# Patient Record
Sex: Male | Born: 1975 | Race: Black or African American | Hispanic: No | Smoking: Former smoker
Health system: Southern US, Community
[De-identification: ages and names within clinical notes are randomized; demographics above are authoritative.]

## PROBLEM LIST (undated history)

## (undated) DIAGNOSIS — I509 Heart failure, unspecified: Secondary | ICD-10-CM

## (undated) DIAGNOSIS — J449 Chronic obstructive pulmonary disease, unspecified: Secondary | ICD-10-CM

## (undated) DIAGNOSIS — M109 Gout, unspecified: Secondary | ICD-10-CM

## (undated) DIAGNOSIS — I10 Essential (primary) hypertension: Secondary | ICD-10-CM

## (undated) DIAGNOSIS — E119 Type 2 diabetes mellitus without complications: Secondary | ICD-10-CM

## (undated) HISTORY — PX: HEART TRANSPLANT: SHX268

## (undated) HISTORY — PX: CARDIAC DEFIBRILLATOR PLACEMENT: SHX171

---

## 2014-02-07 ENCOUNTER — Emergency Department (HOSPITAL_COMMUNITY)
Admission: EM | Admit: 2014-02-07 | Discharge: 2014-02-07 | Disposition: A | Discharge status: Home / Self Care | Payer: Medicaid Other | Attending: Emergency Medicine | Admitting: Emergency Medicine

## 2014-02-07 ENCOUNTER — Emergency Department (HOSPITAL_COMMUNITY): Payer: Medicaid Other

## 2014-02-07 DIAGNOSIS — X58XXXA Exposure to other specified factors, initial encounter: Secondary | ICD-10-CM | POA: Diagnosis not present

## 2014-02-07 DIAGNOSIS — S93409A Sprain of unspecified ligament of unspecified ankle, initial encounter: Secondary | ICD-10-CM | POA: Insufficient documentation

## 2014-02-07 DIAGNOSIS — S93401A Sprain of unspecified ligament of right ankle, initial encounter: Secondary | ICD-10-CM

## 2014-02-07 DIAGNOSIS — Y9289 Other specified places as the place of occurrence of the external cause: Secondary | ICD-10-CM | POA: Insufficient documentation

## 2014-02-07 DIAGNOSIS — S99919A Unspecified injury of unspecified ankle, initial encounter: Secondary | ICD-10-CM

## 2014-02-07 DIAGNOSIS — Y9389 Activity, other specified: Secondary | ICD-10-CM | POA: Insufficient documentation

## 2014-02-07 DIAGNOSIS — S8990XA Unspecified injury of unspecified lower leg, initial encounter: Secondary | ICD-10-CM | POA: Insufficient documentation

## 2014-02-07 DIAGNOSIS — S99929A Unspecified injury of unspecified foot, initial encounter: Secondary | ICD-10-CM

## 2014-02-07 MED ORDER — OXYCODONE-ACETAMINOPHEN 5-325 MG PO TABS: 1.0000 | ORAL_TABLET | ORAL | Status: DC | PRN

## 2014-02-07 MED ORDER — OXYCODONE-ACETAMINOPHEN 5-325 MG PO TABS
1.0000 | ORAL_TABLET | Freq: Once | ORAL | Status: AC
  Administered 2014-02-07: 1 via ORAL
  Filled 2014-02-07: qty 1

## 2014-02-07 NOTE — Discharge Instructions (Signed)
Wear ankle splint orthotic as needed.   Ankle Sprain An ankle sprain is an injury to the strong, fibrous tissues (ligaments) that hold the bones of your ankle joint together.  CAUSES An ankle sprain is usually caused by a fall or by twisting your ankle. Ankle sprains most commonly occur when you step on the outer edge of your foot, and your ankle turns inward. People who participate in sports are more prone to these types of injuries.  SYMPTOMS   Pain in your ankle. The pain may be present at rest or only when you are trying to stand or walk.  Swelling.  Bruising. Bruising may develop immediately or within 1 to 2 days after your injury.  Difficulty standing or walking, particularly when turning corners or changing directions. DIAGNOSIS  Your caregiver will ask you details about your injury and perform a physical exam of your ankle to determine if you have an ankle sprain. During the physical exam, your caregiver will press on and apply pressure to specific areas of your foot and ankle. Your caregiver will try to move your ankle in certain ways. An X-ray exam may be done to be sure a bone was not broken or a ligament did not separate from one of the bones in your ankle (avulsion fracture).  TREATMENT  Certain types of braces can help stabilize your ankle. Your caregiver can make a recommendation for this. Your caregiver may recommend the use of medicine for pain. If your sprain is severe, your caregiver may refer you to a surgeon who helps to restore function to parts of your skeletal system (orthopedist) or a physical therapist. HOME CARE INSTRUCTIONS   Apply ice to your injury for 1-2 days or as directed by your caregiver. Applying ice helps to reduce inflammation and pain.  Put ice in a plastic bag.  Place a towel between your skin and the bag.  Leave the ice on for 15-20 minutes at a time, every 2 hours while you are awake.  Only take over-the-counter or prescription medicines for  pain, discomfort, or fever as directed by your caregiver.  Elevate your injured ankle above the level of your heart as much as possible for 2-3 days.  If your caregiver recommends crutches, use them as instructed. Gradually put weight on the affected ankle. Continue to use crutches or a cane until you can walk without feeling pain in your ankle.  If you have a plaster splint, wear the splint as directed by your caregiver. Do not rest it on anything harder than a pillow for the first 24 hours. Do not put weight on it. Do not get it wet. You may take it off to take a shower or bath.  You may have been given an elastic bandage to wear around your ankle to provide support. If the elastic bandage is too tight (you have numbness or tingling in your foot or your foot becomes cold and blue), adjust the bandage to make it comfortable.  If you have an air splint, you may blow more air into it or let air out to make it more comfortable. You may take your splint off at night and before taking a shower or bath. Wiggle your toes in the splint several times per day to decrease swelling. SEEK MEDICAL CARE IF:   You have rapidly increasing bruising or swelling.  Your toes feel extremely cold or you lose feeling in your foot.  Your pain is not relieved with medicine. SEEK IMMEDIATE MEDICAL CARE  IF:  Your toes are numb or blue.  You have severe pain that is increasing. MAKE SURE YOU:   Understand these instructions.  Will watch your condition.  Will get help right away if you are not doing well or get worse. Document Released: 06/15/2005 Document Revised: 03/09/2012 Document Reviewed: 06/27/2011 [facility] Patient Information 2015 [place], [place]. This information is not intended to replace advice given to you by your health care provider. Make sure you discuss any questions you have with your health care provider.  Crutch Use Crutches are used to take weight off one of your legs or feet when you stand  or walk. It is important to use crutches that fit properly. When fitted properly:  Each crutch should be 2-3 finger widths below the armpit.  Your weight should be supported by your hand, and not by resting the armpit on the crutch.  RISKS AND COMPLICATIONS Damage to the nerves that extend from your armpit to your hand and arm. To prevent this from happening, make sure your crutches fit properly and do not put pressure on your armpit when using them. HOW TO USE YOUR CRUTCHES If you have been instructed to use partial weight bearing, apply (bear) the amount of weight as your health care provider suggests. Do not bear weight in an amount that causes pain to the area of injury. Walking 1. Step with the crutches. 2. Swing the healthy leg slightly ahead of the crutches. Going Up Steps If there is no handrail: 1. Step up with the healthy leg. 2. Step up with the crutches and injured leg. 3. Continue in this way. If there is a handrail: 1. Hold both crutches in one hand. 2. Place your free hand on the handrail. 3. While putting your weight on your arms, lift your healthy leg to the step. 4. Bring the crutches and the injured leg up to that step. 5. Continue in this way. Going Down Steps Be very careful, as going down stairs with crutches is very challenging. If there is no handrail: 1. Step down with the injured leg and crutches. 2. Step down with the healthy leg. If there is a handrail: 1. Place your hand on the handrail. 2. Hold both crutches with your free hand. 3. Lower your injured leg and crutch to the step below you. Make sure to keep the crutch tips in the center of the step, never on the edge. 4. Lower your healthy leg to that step. 5. Continue in this way. Standing Up 1. Hold the injured leg forward. 2. Grab the armrest with one hand and the top of the crutches with the other hand. 3. Using these supports, pull yourself up to a standing position. Sitting Down 1. Hold the  injured leg forward. 2. Grab the armrest with one hand and the top of the crutches with the other hand. 3. Lower yourself to a sitting position. SEEK MEDICAL CARE IF:  You still feel unsteady on your feet.  You develop new pain, for example in your armpits, back, shoulder, wrist, or hip.  You develop any numbness or tingling. SEEK IMMEDIATE MEDICAL CARE IF: You fall. Document Released: 06/12/2000 Document Revised: 06/20/2013 Document Reviewed: 02/20/2013 [facility] Patient Information 2015 [place], [place]. This information is not intended to replace advice given to you by your health care provider. Make sure you discuss any questions you have with your health care provider.  Acetaminophen; Oxycodone tablets What is this medicine? ACETAMINOPHEN; OXYCODONE (a set a MEE noe fen; ox i  KOE done) is a pain reliever. It is used to treat mild to moderate pain. This medicine may be used for other purposes; ask your health care provider or pharmacist if you have questions. COMMON BRAND NAME(S): Endocet, Magnacet, Narvox, Percocet, Perloxx, Primalev, Primlev, Roxicet, Xolox What should I tell my health care provider before I take this medicine? They need to know if you have any of these conditions: -brain tumor -Crohn's disease, inflammatory bowel disease, or ulcerative colitis -drug abuse or addiction -head injury -heart or circulation problems -if you often drink alcohol -kidney disease or problems going to the bathroom -liver disease -lung disease, asthma, or breathing problems -an unusual or allergic reaction to acetaminophen, oxycodone, other opioid analgesics, other medicines, foods, dyes, or preservatives -pregnant or trying to get pregnant -breast-feeding How should I use this medicine? Take this medicine by mouth with a full glass of water. Follow the directions on the prescription label. Take your medicine at regular intervals. Do not take your medicine more often than directed. Talk  to your pediatrician regarding the use of this medicine in children. Special care may be needed. Patients over 39 years old may have a stronger reaction and need a smaller dose. Overdosage: If you think you have taken too much of this medicine contact a poison control center or emergency room at once. NOTE: This medicine is only for you. Do not share this medicine with others. What if I miss a dose? If you miss a dose, take it as soon as you can. If it is almost time for your next dose, take only that dose. Do not take double or extra doses. What may interact with this medicine? -alcohol -antihistamines -barbiturates like amobarbital, butalbital, butabarbital, methohexital, pentobarbital, phenobarbital, thiopental, and secobarbital -benztropine -drugs for bladder problems like solifenacin, trospium, oxybutynin, tolterodine, hyoscyamine, and methscopolamine -drugs for breathing problems like ipratropium and tiotropium -drugs for certain stomach or intestine problems like propantheline, homatropine methylbromide, glycopyrrolate, atropine, belladonna, and dicyclomine -general anesthetics like etomidate, ketamine, nitrous oxide, propofol, desflurane, enflurane, halothane, isoflurane, and sevoflurane -medicines for depression, anxiety, or psychotic disturbances -medicines for sleep -muscle relaxants -naltrexone -narcotic medicines (opiates) for pain -phenothiazines like perphenazine, thioridazine, chlorpromazine, mesoridazine, fluphenazine, prochlorperazine, promazine, and trifluoperazine -scopolamine -tramadol -trihexyphenidyl This list may not describe all possible interactions. Give your health care provider a list of all the medicines, herbs, non-prescription drugs, or dietary supplements you use. Also tell them if you smoke, drink alcohol, or use illegal drugs. Some items may interact with your medicine. What should I watch for while using this medicine? Tell your doctor or health care  professional if your pain does not go away, if it gets worse, or if you have new or a different type of pain. You may develop tolerance to the medicine. Tolerance means that you will need a higher dose of the medication for pain relief. Tolerance is normal and is expected if you take this medicine for a long time. Do not suddenly stop taking your medicine because you may develop a severe reaction. Your body becomes used to the medicine. This does NOT mean you are addicted. Addiction is a behavior related to getting and using a drug for a non-medical reason. If you have pain, you have a medical reason to take pain medicine. Your doctor will tell you how much medicine to take. If your doctor wants you to stop the medicine, the dose will be slowly lowered over time to avoid any side effects. You may get drowsy or dizzy. Do  not drive, use machinery, or do anything that needs mental alertness until you know how this medicine affects you. Do not stand or sit up quickly, especially if you are an older patient. This reduces the risk of dizzy or fainting spells. Alcohol may interfere with the effect of this medicine. Avoid alcoholic drinks. There are different types of narcotic medicines (opiates) for pain. If you take more than one type at the same time, you may have more side effects. Give your health care provider a list of all medicines you use. Your doctor will tell you how much medicine to take. Do not take more medicine than directed. Call emergency for help if you have problems breathing. The medicine will cause constipation. Try to have a bowel movement at least every 2 to 3 days. If you do not have a bowel movement for 3 days, call your doctor or health care professional. Do not take Tylenol (acetaminophen) or medicines that have acetaminophen with this medicine. Too much acetaminophen can be very dangerous. Many nonprescription medicines contain acetaminophen. Always read the labels carefully to avoid taking  more acetaminophen. What side effects may I notice from receiving this medicine? Side effects that you should report to your doctor or health care professional as soon as possible: -allergic reactions like skin rash, itching or hives, swelling of the face, lips, or tongue -breathing difficulties, wheezing -confusion -light headedness or fainting spells -severe stomach pain -unusually weak or tired -yellowing of the skin or the whites of the eyes Side effects that usually do not require medical attention (report to your doctor or health care professional if they continue or are bothersome): -dizziness -drowsiness -nausea -vomiting This list may not describe all possible side effects. Call your doctor for medical advice about side effects. You may report side effects to FDA at 1-800-FDA-1088. Where should I keep my medicine? Keep out of the reach of children. This medicine can be abused. Keep your medicine in a safe place to protect it from theft. Do not share this medicine with anyone. Selling or giving away this medicine is dangerous and against the law. Store at room temperature between 20 and 25 degrees C (68 and 77 degrees F). Keep container tightly closed. Protect from light. This medicine may cause accidental overdose and death if it is taken by other adults, children, or pets. Flush any unused medicine down the toilet to reduce the chance of harm. Do not use the medicine after the expiration date. NOTE: This sheet is a summary. It may not cover all possible information. If you have questions about this medicine, talk to your doctor, pharmacist, or health care provider.  2015, [employer]. (2013-02-06 13:17:35)

## 2014-02-07 NOTE — ED Provider Notes (Signed)
CSN: [id number]     Arrival date & time 02/07/14  0438 History   First MD Initiated Contact with Patient 02/07/14 [phone]     Chief complaint: Injured right ankle  (Consider location/radiation/quality/duration/timing/severity/associated sxs/prior Treatment) The history is provided by the patient.   38 year old male injured his right ankle with an inversion injury. He stepped on some uneven concrete. He is complaining of severe pain in the lateral aspect of the right ankle. He rates pain at 10/10. He denies other injury. Pain is worse with weightbearing or movement or palpation.  No past medical history on file. No past surgical history on file. No family history on file. History  Substance Use Topics  . Smoking status: Not on file  . Smokeless tobacco: Not on file  . Alcohol Use: Not on file    Review of Systems  All other systems reviewed and are negative.     Allergies  Review of patient's allergies indicates no known allergies.  Home Medications   Prior to Admission medications   Medication Sig Start Date End Date Taking? Authorizing Provider  oxyCODONE-acetaminophen (PERCOCET) 5-325 MG per tablet Take 1 tablet by mouth every 4 (four) hours as needed for moderate pain. 02/07/14   Dione Boozeavid Darbi Chandran, MD   Physical Exam  Nursing note and vitals reviewed.  38 year old male, resting comfortably and in no acute distress. Vital signs are normal. Oxygen saturation is 98%, which is normal. Head is normocephalic and atraumatic. PERRLA, EOMI. Oropharynx is clear. Neck is nontender and supple without adenopathy or JVD. Back is nontender and there is no CVA tenderness. Lungs are clear without rales, wheezes, or rhonchi. Chest is nontender. Heart has regular rate and rhythm without murmur. Abdomen is soft, flat, nontender without masses or hepatosplenomegaly and peristalsis is normoactive. Extremities: There is moderate swelling of the lateral ossicular right ankle with tenderness to  palpation over the anterior fibulotalar ligament. There is no instability. There is no tenderness anteriorly, medially, or posteriorly. Distal neurovascular exam is intact with strong dorsalis pedis pulse, capillary refill, normal sensation. Skin is warm and dry without rash. Neurologic: Mental status is normal, cranial nerves are intact, there are no motor or sensory deficits.  ED Course  Procedures (including critical care time)  Imaging Review Dg Ankle Complete Right  02/07/2014   CLINICAL DATA:  Status post fall; right anterior and lateral ankle pain.  EXAM: RIGHT ANKLE - 2+ VIEWS  COMPARISON:  None.  FINDINGS: There is no evidence of fracture or dislocation. The ankle mortise is intact; the interosseous space is within normal limits. No talar tilt or subluxation is seen. An os peroneum is noted.  The joint spaces are preserved. No significant soft tissue abnormalities are seen.  IMPRESSION: 1. No evidence of fracture or dislocation. 2. Os peroneum noted.   Electronically Signed   By: Roanna RaiderJeffery  Chang M.D.   On: 02/07/2014 03:06   Images viewed by me.  MDM   Final diagnoses:  Sprain of right ankle, initial encounter    Sprain of the right ankle. X-rays show no evidence of fracture. He is placed in an ankle splint orthotic and given crutches and given a prescription for oxycodone and acetaminophen. He is referred to orthopedics for followup.    Dione Boozeavid Rakin Lemelle, MD 02/07/14 [phone]

## 2014-08-04 ENCOUNTER — Emergency Department (HOSPITAL_BASED_OUTPATIENT_CLINIC_OR_DEPARTMENT_OTHER): Payer: Medicaid Other

## 2014-08-04 ENCOUNTER — Encounter (HOSPITAL_BASED_OUTPATIENT_CLINIC_OR_DEPARTMENT_OTHER): Payer: Self-pay | Admitting: *Deleted

## 2014-08-04 ENCOUNTER — Emergency Department (HOSPITAL_BASED_OUTPATIENT_CLINIC_OR_DEPARTMENT_OTHER)
Admission: EM | Admit: 2014-08-04 | Discharge: 2014-08-04 | Disposition: A | Discharge status: Home / Self Care | Payer: Medicaid Other | Attending: Emergency Medicine | Admitting: Emergency Medicine

## 2014-08-04 DIAGNOSIS — I509 Heart failure, unspecified: Secondary | ICD-10-CM | POA: Insufficient documentation

## 2014-08-04 DIAGNOSIS — Z87891 Personal history of nicotine dependence: Secondary | ICD-10-CM | POA: Insufficient documentation

## 2014-08-04 DIAGNOSIS — I10 Essential (primary) hypertension: Secondary | ICD-10-CM | POA: Insufficient documentation

## 2014-08-04 DIAGNOSIS — Z95810 Presence of automatic (implantable) cardiac defibrillator: Secondary | ICD-10-CM | POA: Insufficient documentation

## 2014-08-04 DIAGNOSIS — M79672 Pain in left foot: Secondary | ICD-10-CM

## 2014-08-04 DIAGNOSIS — Z7982 Long term (current) use of aspirin: Secondary | ICD-10-CM | POA: Diagnosis not present

## 2014-08-04 DIAGNOSIS — J449 Chronic obstructive pulmonary disease, unspecified: Secondary | ICD-10-CM | POA: Diagnosis not present

## 2014-08-04 DIAGNOSIS — E119 Type 2 diabetes mellitus without complications: Secondary | ICD-10-CM | POA: Diagnosis not present

## 2014-08-04 DIAGNOSIS — Z79899 Other long term (current) drug therapy: Secondary | ICD-10-CM | POA: Insufficient documentation

## 2014-08-04 HISTORY — DX: Gout, unspecified: M10.9

## 2014-08-04 HISTORY — DX: Heart failure, unspecified: I50.9

## 2014-08-04 HISTORY — DX: Type 2 diabetes mellitus without complications: E11.9

## 2014-08-04 HISTORY — DX: Essential (primary) hypertension: I10

## 2014-08-04 HISTORY — DX: Chronic obstructive pulmonary disease, unspecified: J44.9

## 2014-08-04 MED ORDER — INDOMETHACIN 25 MG PO CAPS: 25.0000 mg | ORAL_CAPSULE | Freq: Three times a day (TID) | ORAL | Status: DC | PRN

## 2014-08-04 MED ORDER — HYDROCODONE-ACETAMINOPHEN 5-325 MG PO TABS: 1.0000 | ORAL_TABLET | Freq: Four times a day (QID) | ORAL | Status: DC | PRN

## 2014-08-04 NOTE — ED Notes (Signed)
Pt was d/c from the hospital yesterday for COPD. Was instructed to walk a few times a day. States that his foot was in pain since Wednesday. Worsened today

## 2014-08-04 NOTE — Discharge Instructions (Signed)
Indomethacin as prescribed. Hydrocodone as prescribed as needed for pain.  Return to the emergency department if your symptoms substantially worsen, and follow-up with your Dr. if not improving in the next week.   Musculoskeletal Pain Musculoskeletal pain is muscle and boney aches and pains. These pains can occur in any part of the body. Your caregiver may treat you without knowing the cause of the pain. They may treat you if blood or urine tests, X-rays, and other tests were normal.  CAUSES There is often not a definite cause or reason for these pains. These pains may be caused by a type of germ (virus). The discomfort may also come from overuse. Overuse includes working out too hard when your body is not fit. Boney aches also come from weather changes. Bone is sensitive to atmospheric pressure changes. HOME CARE INSTRUCTIONS   Ask when your test results will be ready. Make sure you get your test results.  Only take over-the-counter or prescription medicines for pain, discomfort, or fever as directed by your caregiver. If you were given medications for your condition, do not drive, operate machinery or power tools, or sign legal documents for 24 hours. Do not drink alcohol. Do not take sleeping pills or other medications that may interfere with treatment.  Continue all activities unless the activities cause more pain. When the pain lessens, slowly resume normal activities. Gradually increase the intensity and duration of the activities or exercise.  During periods of severe pain, bed rest may be helpful. Lay or sit in any position that is comfortable.  Putting ice on the injured area.  Put ice in a bag.  Place a towel between your skin and the bag.  Leave the ice on for 15 to 20 minutes, 3 to 4 times a day.  Follow up with your caregiver for continued problems and no reason can be found for the pain. If the pain becomes worse or does not go away, it may be necessary to repeat tests or do  additional testing. Your caregiver may need to look further for a possible cause. SEEK IMMEDIATE MEDICAL CARE IF:  You have pain that is getting worse and is not relieved by medications.  You develop chest pain that is associated with shortness or breath, sweating, feeling sick to your stomach (nauseous), or throw up (vomit).  Your pain becomes localized to the abdomen.  You develop any new symptoms that seem different or that concern you. MAKE SURE YOU:   Understand these instructions.  Will watch your condition.  Will get help right away if you are not doing well or get worse. Document Released: 06/15/2005 Document Revised: 09/07/2011 Document Reviewed: 02/17/2013 [facility] Patient Information 2015 [place]. This information is not intended to replace advice given to you by your health care provider. Make sure you discuss any questions you have with your health care provider.

## 2014-08-04 NOTE — ED Provider Notes (Signed)
CSN: [id number]     Arrival date & time 08/04/14  1308 History   First MD Initiated Contact with Patient 08/04/14 1437     Chief Complaint  Patient presents with  . Foot Pain     (Consider location/radiation/quality/duration/timing/severity/associated sxs/prior Treatment) HPI Comments: Patient is a 39 year old male with history of congestive heart failure. He was recently admitted and diuresis of a large amount of lower extremity edema. Yesterday he started with significant discomfort to the left foot in the absence of any injury or trauma. He denies any calf pain. He denies any chest pain or shortness of breath.  Patient is a 40 y.o. male presenting with lower extremity pain. The history is provided by the patient.  Foot Pain This is a new problem. The current episode started yesterday. The problem occurs constantly. The problem has been rapidly worsening. The symptoms are aggravated by walking (Weightbearing). Nothing relieves the symptoms. He has tried nothing for the symptoms. The treatment provided no relief.    Past Medical History  Diagnosis Date  . COPD (chronic obstructive pulmonary disease)   . Diabetes mellitus without complication   . Hypertension   . Gout   . CHF (congestive heart failure)    Past Surgical History  Procedure Laterality Date  . Cardiac defibrillator placement     No family history on file. History  Substance Use Topics  . Smoking status: Former Games developer  . Smokeless tobacco: Not on file  . Alcohol Use: No    Review of Systems  All other systems reviewed and are negative.     Allergies  Review of patient's allergies indicates no known allergies.  Home Medications   Prior to Admission medications   Medication Sig Start Date End Date Taking? Authorizing Provider  aspirin 81 MG tablet Take 81 mg by mouth daily.   Yes Historical Provider, MD  carvedilol (COREG) 6.25 MG tablet Take 6.25 mg by mouth 2 (two) times daily with a meal.   Yes  Historical Provider, MD  glimepiride (AMARYL) 4 MG tablet Take 4 mg by mouth daily with breakfast.   Yes Historical Provider, MD  lisinopril (PRINIVIL,ZESTRIL) 20 MG tablet Take 20 mg by mouth daily.   Yes Historical Provider, MD  magnesium oxide (MAG-OX) 400 MG tablet Take 400 mg by mouth daily.   Yes Historical Provider, MD  metFORMIN (GLUCOPHAGE) 1000 MG tablet Take 1,000 mg by mouth 2 (two) times daily with a meal.   Yes Historical Provider, MD  potassium chloride SA (KLOR-CON M15) 15 MEQ tablet Take 20 mEq by mouth daily.   Yes Historical Provider, MD  spironolactone (ALDACTONE) 25 MG tablet Take 25 mg by mouth daily.   Yes Historical Provider, MD  torsemide (DEMADEX) 20 MG tablet Take 20 mg by mouth daily.   Yes Historical Provider, MD  zolpidem (AMBIEN) 10 MG tablet Take 10 mg by mouth at bedtime as needed for sleep.   Yes Historical Provider, MD  oxyCODONE-acetaminophen (PERCOCET) 5-325 MG per tablet Take 1 tablet by mouth every 4 (four) hours as needed for moderate pain. 02/07/14   Dione Booze, MD   BP 101/70 mmHg  Pulse 85  Temp(Src) 98.5 F (36.9 C) (Oral)  Resp 18  Ht  (1.88 m)  Wt 204 lb (92.534 kg)  BMI 26.18 kg/m2  SpO2 96% Physical Exam  Constitutional: He is oriented to person, place, and time. He appears well-developed and well-nourished. No distress.  HENT:  Head: Normocephalic and atraumatic.  Neck: Normal range  of motion. Neck supple.  Musculoskeletal:  The left foot is exquisitely tender and warm to the touch over the lateral aspect. There is pain with range of motion. There is no calf tenderness and Homans sign is absent. Dorsalis pedis pulses are palpable.  Neurological: He is alert and oriented to person, place, and time.  Skin: Skin is warm and dry. He is not diaphoretic.  Nursing note and vitals reviewed.   ED Course  Procedures (including critical care time) Labs Review Labs Reviewed - No data to display  Imaging Review No results found.   EKG  Interpretation None      MDM   Final diagnoses:  Left foot pain    X-rays are unremarkable. This patient has been on multiple diuretics during his recent hospitalization and I suspect this may be a flareup of gout. He will be treated with indomethacin, pain medication, and when necessary return. I highly doubt DVT.    Kevin Lyonsouglas Sumayya Muha, MD 08/04/14 [id number]

## 2015-06-25 ENCOUNTER — Telehealth (HOSPITAL_COMMUNITY): Payer: Self-pay | Admitting: *Deleted

## 2015-07-16 ENCOUNTER — Encounter: Payer: Self-pay | Admitting: Cardiology

## 2015-07-25 ENCOUNTER — Encounter (HOSPITAL_COMMUNITY)
Admission: RE | Admit: 2015-07-25 | Discharge: 2015-07-25 | Disposition: A | Discharge status: Home / Self Care | Payer: Medicaid Other | Source: Ambulatory Visit | Attending: Cardiology | Admitting: Cardiology

## 2015-07-25 DIAGNOSIS — Z95811 Presence of heart assist device: Secondary | ICD-10-CM | POA: Insufficient documentation

## 2015-07-25 DIAGNOSIS — I5022 Chronic systolic (congestive) heart failure: Secondary | ICD-10-CM | POA: Insufficient documentation

## 2015-07-29 ENCOUNTER — Encounter (HOSPITAL_COMMUNITY): Payer: Self-pay

## 2015-07-29 ENCOUNTER — Encounter (HOSPITAL_COMMUNITY)
Admission: RE | Admit: 2015-07-29 | Discharge: 2015-07-29 | Disposition: A | Discharge status: Home / Self Care | Payer: Medicaid Other | Source: Ambulatory Visit | Attending: Cardiology | Admitting: Cardiology

## 2015-07-29 DIAGNOSIS — Z95811 Presence of heart assist device: Secondary | ICD-10-CM | POA: Diagnosis not present

## 2015-07-29 DIAGNOSIS — I5022 Chronic systolic (congestive) heart failure: Secondary | ICD-10-CM | POA: Diagnosis not present

## 2015-07-29 LAB — GLUCOSE, CAPILLARY: Glucose-Capillary: 307 mg/dL — ABNORMAL HIGH (ref 65–99)

## 2015-07-29 NOTE — Progress Notes (Signed)
Pt arrived at cardiac rehab with CBG-307.  Pt did not exercise. Pt asymptomatic. Pt reports he does not consistently take his novolog 70//30 insulin because his CBG readings at home are often 130.  Pt feels he does need insulin with this reading.   Pt has not taken insulin in 2 days.   Pt instructed to take his insulin as directed and eat prior to arrival at cardiac rehab.  Pt PCP Virl Son [phone], fax [phone] made aware.  Medication list reconciled.  Pt brought his pill bottles with him. PHQ-0. Pt has positive outlook with supportive family.  Pt does express some concerns about having LVAD but overall is hopeful and optimistic.    No psychosocial needs identfied, no interventions necessary.  Pt enjoys playing with his children ages 69 and 48, bowling, playing cards and traveling. Pt states he is not interested in traveling now with his LVAD and prefers to wait until after transplant to travel again.  Pt brought LVAD travel bag with backup battery and clips.  Will continue to monitor. Pt goals for cardiac rehab are to increase strength and stamina. Pt encouraged to participate in home exercises in addition to cardiac rehab activities to increase ability to achieve this goal.  Pt verbalized understanding.

## 2015-07-31 ENCOUNTER — Encounter (HOSPITAL_COMMUNITY)
Admission: RE | Admit: 2015-07-31 | Discharge: 2015-07-31 | Disposition: A | Discharge status: Home / Self Care | Payer: Medicaid Other | Source: Ambulatory Visit | Attending: Cardiology | Admitting: Cardiology

## 2015-07-31 DIAGNOSIS — Z95811 Presence of heart assist device: Secondary | ICD-10-CM | POA: Insufficient documentation

## 2015-07-31 DIAGNOSIS — I5022 Chronic systolic (congestive) heart failure: Secondary | ICD-10-CM | POA: Diagnosis not present

## 2015-07-31 LAB — GLUCOSE, CAPILLARY
Glucose-Capillary: 160 mg/dL — ABNORMAL HIGH (ref 65–99)
Glucose-Capillary: 115 mg/dL — ABNORMAL HIGH (ref 65–99)

## 2015-07-31 NOTE — Progress Notes (Signed)
Pt started cardiac rehab today.  Pt tolerated light exercise without difficulty. VSS, telemetry-wide QRS, asymptomatic.  CBG stable for exercise.  Medication list reconciled.  Pt verbalized compliance with medications and denies barriers to compliance. PSYCHOSOCIAL ASSESSMENT:  PHQ-0. Pt exhibits positive coping skills, hopeful outlook with supportive family. No psychosocial needs identified at this time, no psychosocial interventions necessary.  Pt oriented to exercise equipment and routine.  Understanding verbalized.

## 2015-08-02 ENCOUNTER — Encounter (HOSPITAL_COMMUNITY)
Admission: RE | Admit: 2015-08-02 | Discharge: 2015-08-02 | Disposition: A | Discharge status: Home / Self Care | Payer: Medicaid Other | Source: Ambulatory Visit | Attending: Cardiology | Admitting: Cardiology

## 2015-08-02 DIAGNOSIS — Z95811 Presence of heart assist device: Secondary | ICD-10-CM | POA: Diagnosis not present

## 2015-08-02 LAB — GLUCOSE, CAPILLARY
Glucose-Capillary: 113 mg/dL — ABNORMAL HIGH (ref 65–99)
Glucose-Capillary: 67 mg/dL (ref 65–99)
Glucose-Capillary: 82 mg/dL (ref 65–99)

## 2015-08-05 ENCOUNTER — Encounter (HOSPITAL_COMMUNITY)
Admission: RE | Admit: 2015-08-05 | Discharge: 2015-08-05 | Disposition: A | Discharge status: Home / Self Care | Payer: Medicaid Other | Source: Ambulatory Visit | Attending: Cardiology | Admitting: Cardiology

## 2015-08-05 DIAGNOSIS — Z95811 Presence of heart assist device: Secondary | ICD-10-CM | POA: Diagnosis not present

## 2015-08-05 LAB — GLUCOSE, CAPILLARY: GLUCOSE-CAPILLARY: 106 mg/dL — AB (ref 65–99)

## 2015-08-07 ENCOUNTER — Encounter (HOSPITAL_COMMUNITY): Payer: Medicaid Other

## 2015-08-09 ENCOUNTER — Telehealth (HOSPITAL_COMMUNITY): Payer: Self-pay | Admitting: Cardiac Rehabilitation

## 2015-08-09 ENCOUNTER — Encounter (HOSPITAL_COMMUNITY): Payer: Medicaid Other

## 2015-08-09 NOTE — Telephone Encounter (Signed)
  Spoke to [place], VAD Coordinator for [facility] to request permission for pt to return to cardiac rehab.  Verbal clearance given.  Form faxed for signature.  Pt made aware.  Understanding verbalized

## 2015-08-12 ENCOUNTER — Encounter (HOSPITAL_COMMUNITY)
Admission: RE | Admit: 2015-08-12 | Discharge: 2015-08-12 | Disposition: A | Discharge status: Home / Self Care | Payer: Medicaid Other | Source: Ambulatory Visit | Attending: Cardiology | Admitting: Cardiology

## 2015-08-12 DIAGNOSIS — Z95811 Presence of heart assist device: Secondary | ICD-10-CM | POA: Diagnosis not present

## 2015-08-12 LAB — GLUCOSE, CAPILLARY
GLUCOSE-CAPILLARY: 174 mg/dL — AB (ref 65–99)
GLUCOSE-CAPILLARY: 143 mg/dL — AB (ref 65–99)

## 2015-08-14 ENCOUNTER — Encounter (HOSPITAL_COMMUNITY): Payer: Medicaid Other

## 2015-08-16 ENCOUNTER — Encounter (HOSPITAL_COMMUNITY)
Admission: RE | Admit: 2015-08-16 | Discharge: 2015-08-16 | Disposition: A | Discharge status: Home / Self Care | Payer: Medicaid Other | Source: Ambulatory Visit | Attending: Cardiology | Admitting: Cardiology

## 2015-08-16 DIAGNOSIS — Z95811 Presence of heart assist device: Secondary | ICD-10-CM | POA: Diagnosis not present

## 2015-08-16 LAB — GLUCOSE, CAPILLARY: Glucose-Capillary: 191 mg/dL — ABNORMAL HIGH (ref 65–99)

## 2015-08-19 ENCOUNTER — Encounter (HOSPITAL_COMMUNITY)
Admission: RE | Admit: 2015-08-19 | Discharge: 2015-08-19 | Disposition: A | Discharge status: Home / Self Care | Payer: Medicaid Other | Source: Ambulatory Visit | Attending: Cardiology | Admitting: Cardiology

## 2015-08-19 DIAGNOSIS — Z95811 Presence of heart assist device: Secondary | ICD-10-CM | POA: Diagnosis not present

## 2015-08-19 LAB — GLUCOSE, CAPILLARY
GLUCOSE-CAPILLARY: 178 mg/dL — AB (ref 65–99)
Glucose-Capillary: 204 mg/dL — ABNORMAL HIGH (ref 65–99)

## 2015-08-21 ENCOUNTER — Encounter (HOSPITAL_COMMUNITY): Payer: Medicaid Other

## 2015-08-21 ENCOUNTER — Telehealth (HOSPITAL_COMMUNITY): Payer: Self-pay

## 2015-08-23 ENCOUNTER — Encounter (HOSPITAL_COMMUNITY): Payer: Medicaid Other

## 2015-08-26 ENCOUNTER — Encounter (HOSPITAL_COMMUNITY): Payer: Medicaid Other

## 2015-08-28 ENCOUNTER — Encounter (HOSPITAL_COMMUNITY): Payer: Medicaid Other

## 2015-08-30 ENCOUNTER — Encounter (HOSPITAL_COMMUNITY): Payer: Medicaid Other

## 2015-09-02 ENCOUNTER — Encounter (HOSPITAL_COMMUNITY): Payer: Medicaid Other

## 2015-09-04 ENCOUNTER — Encounter (HOSPITAL_COMMUNITY): Payer: Medicaid Other

## 2015-09-06 ENCOUNTER — Encounter (HOSPITAL_COMMUNITY): Payer: Medicaid Other

## 2015-09-09 ENCOUNTER — Encounter (HOSPITAL_COMMUNITY): Payer: Medicaid Other

## 2015-09-11 ENCOUNTER — Encounter (HOSPITAL_COMMUNITY): Payer: Medicaid Other

## 2015-09-13 ENCOUNTER — Encounter (HOSPITAL_COMMUNITY): Payer: Medicaid Other

## 2015-09-16 ENCOUNTER — Encounter (HOSPITAL_COMMUNITY): Payer: Medicaid Other

## 2015-09-18 ENCOUNTER — Telehealth (HOSPITAL_COMMUNITY): Payer: Self-pay | Admitting: Cardiac Rehabilitation

## 2015-09-18 ENCOUNTER — Encounter (HOSPITAL_COMMUNITY): Payer: Medicaid Other

## 2015-09-18 NOTE — Telephone Encounter (Signed)
pc to pt assess reason for continued absence from cardiac rehab.  Left message on answering machine.

## 2015-09-20 ENCOUNTER — Encounter (HOSPITAL_COMMUNITY): Payer: Medicaid Other

## 2015-09-23 ENCOUNTER — Encounter (HOSPITAL_COMMUNITY): Payer: Medicaid Other

## 2015-09-25 ENCOUNTER — Encounter (HOSPITAL_COMMUNITY): Payer: Medicaid Other

## 2015-09-25 ENCOUNTER — Telehealth (HOSPITAL_COMMUNITY): Payer: Self-pay | Admitting: Cardiac Rehabilitation

## 2015-09-25 NOTE — Telephone Encounter (Signed)
Phone call to pt to assess reason for continued absence from cardiac rehab. Left message on voice mail.

## 2015-09-27 ENCOUNTER — Encounter (HOSPITAL_COMMUNITY): Payer: Medicaid Other

## 2015-09-30 ENCOUNTER — Encounter (HOSPITAL_COMMUNITY): Payer: Medicaid Other

## 2015-10-02 ENCOUNTER — Encounter (HOSPITAL_COMMUNITY): Payer: Medicaid Other

## 2015-10-04 ENCOUNTER — Encounter (HOSPITAL_COMMUNITY): Payer: Medicaid Other

## 2015-10-07 ENCOUNTER — Encounter (HOSPITAL_COMMUNITY): Payer: Medicaid Other

## 2015-10-09 ENCOUNTER — Encounter (HOSPITAL_COMMUNITY): Payer: Medicaid Other

## 2015-10-11 ENCOUNTER — Encounter (HOSPITAL_COMMUNITY): Payer: Medicaid Other

## 2015-10-14 ENCOUNTER — Encounter (HOSPITAL_COMMUNITY): Payer: Medicaid Other

## 2015-10-16 ENCOUNTER — Encounter (HOSPITAL_COMMUNITY): Payer: Medicaid Other

## 2015-10-18 ENCOUNTER — Encounter (HOSPITAL_COMMUNITY): Payer: Medicaid Other

## 2015-11-11 ENCOUNTER — Encounter (HOSPITAL_COMMUNITY): Payer: Self-pay | Admitting: Cardiac Rehabilitation

## 2015-11-11 NOTE — Progress Notes (Signed)
Pt discharged from cardiac rehab program due to non attendance.  

## 2016-09-02 IMAGING — CR DG FOOT COMPLETE 3+V*L*
3 series · 3 of 3 positions shown · non-contrast
Comparison: None.

CLINICAL DATA: Left foot pain laterally 3 days.  No injury.

EXAM:
LEFT FOOT - COMPLETE 3+ VIEW

[t foot ap left]
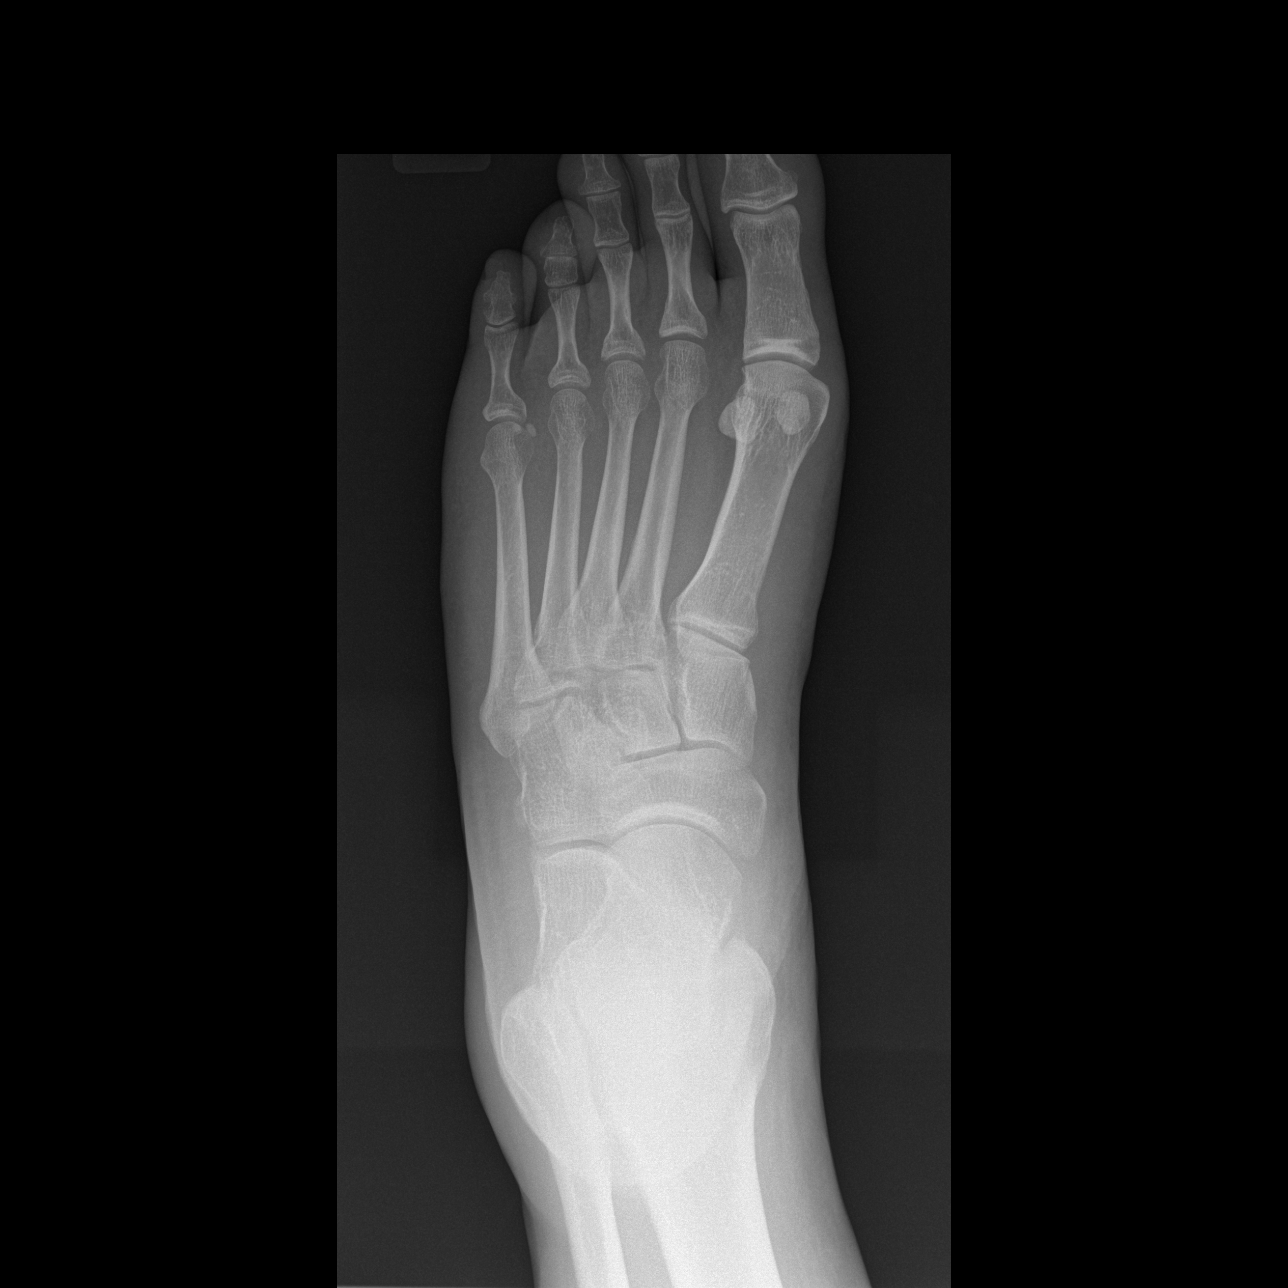

[t foot oblique left]
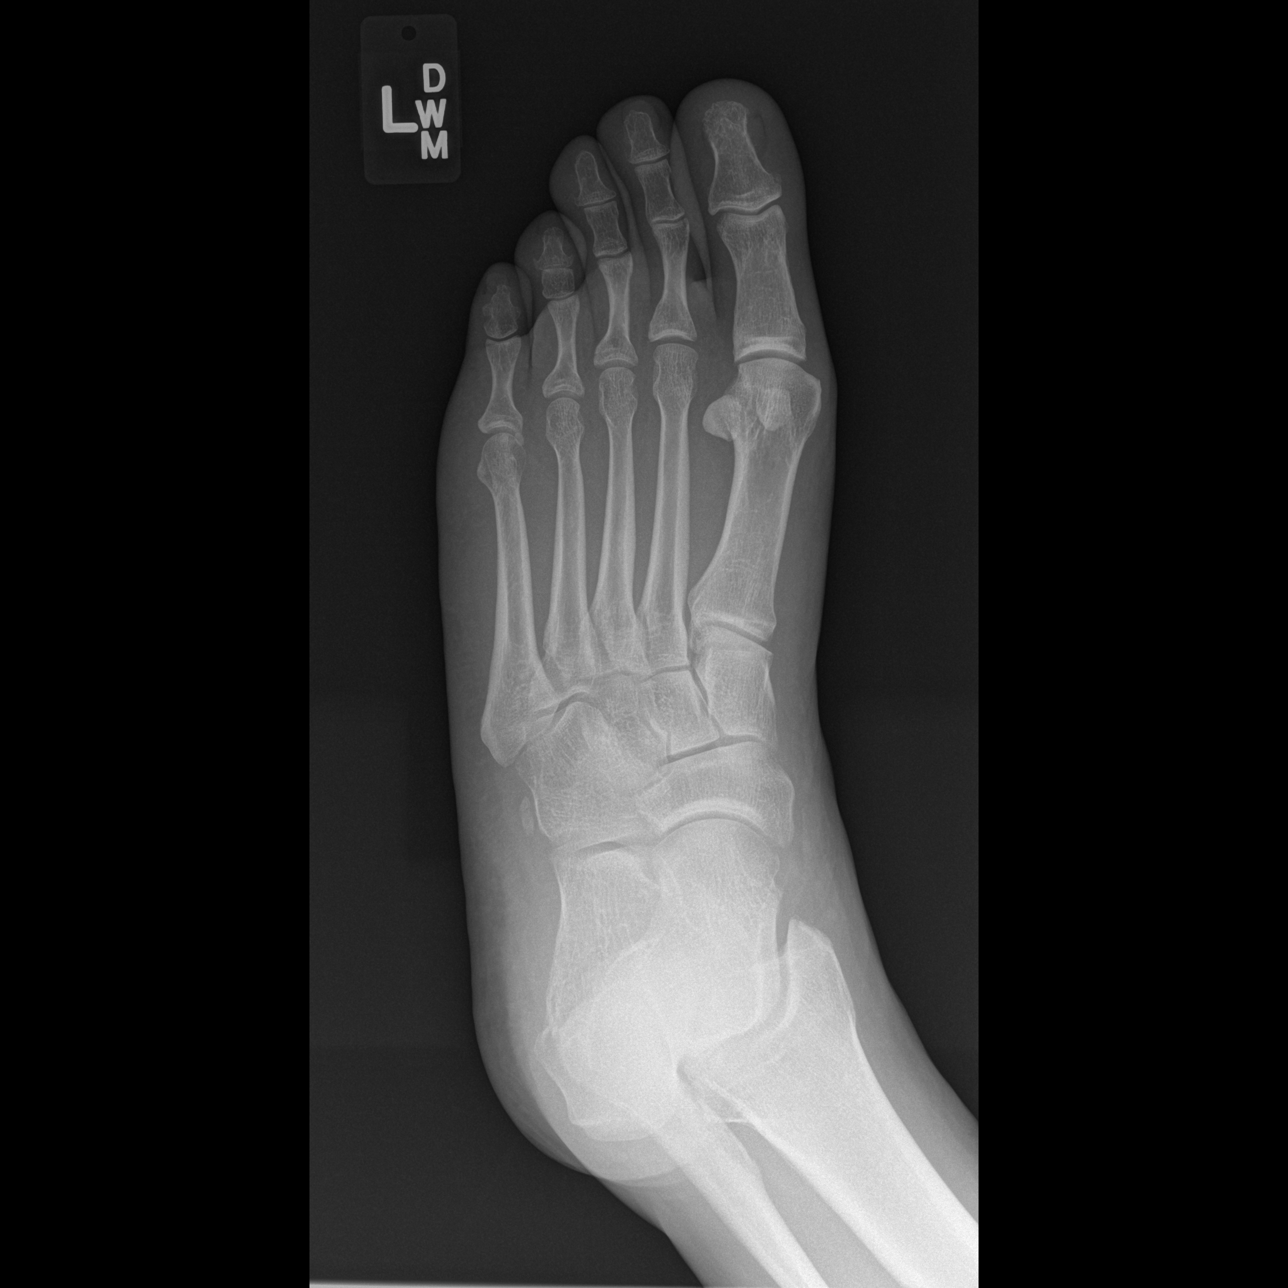

[t foot lat left]
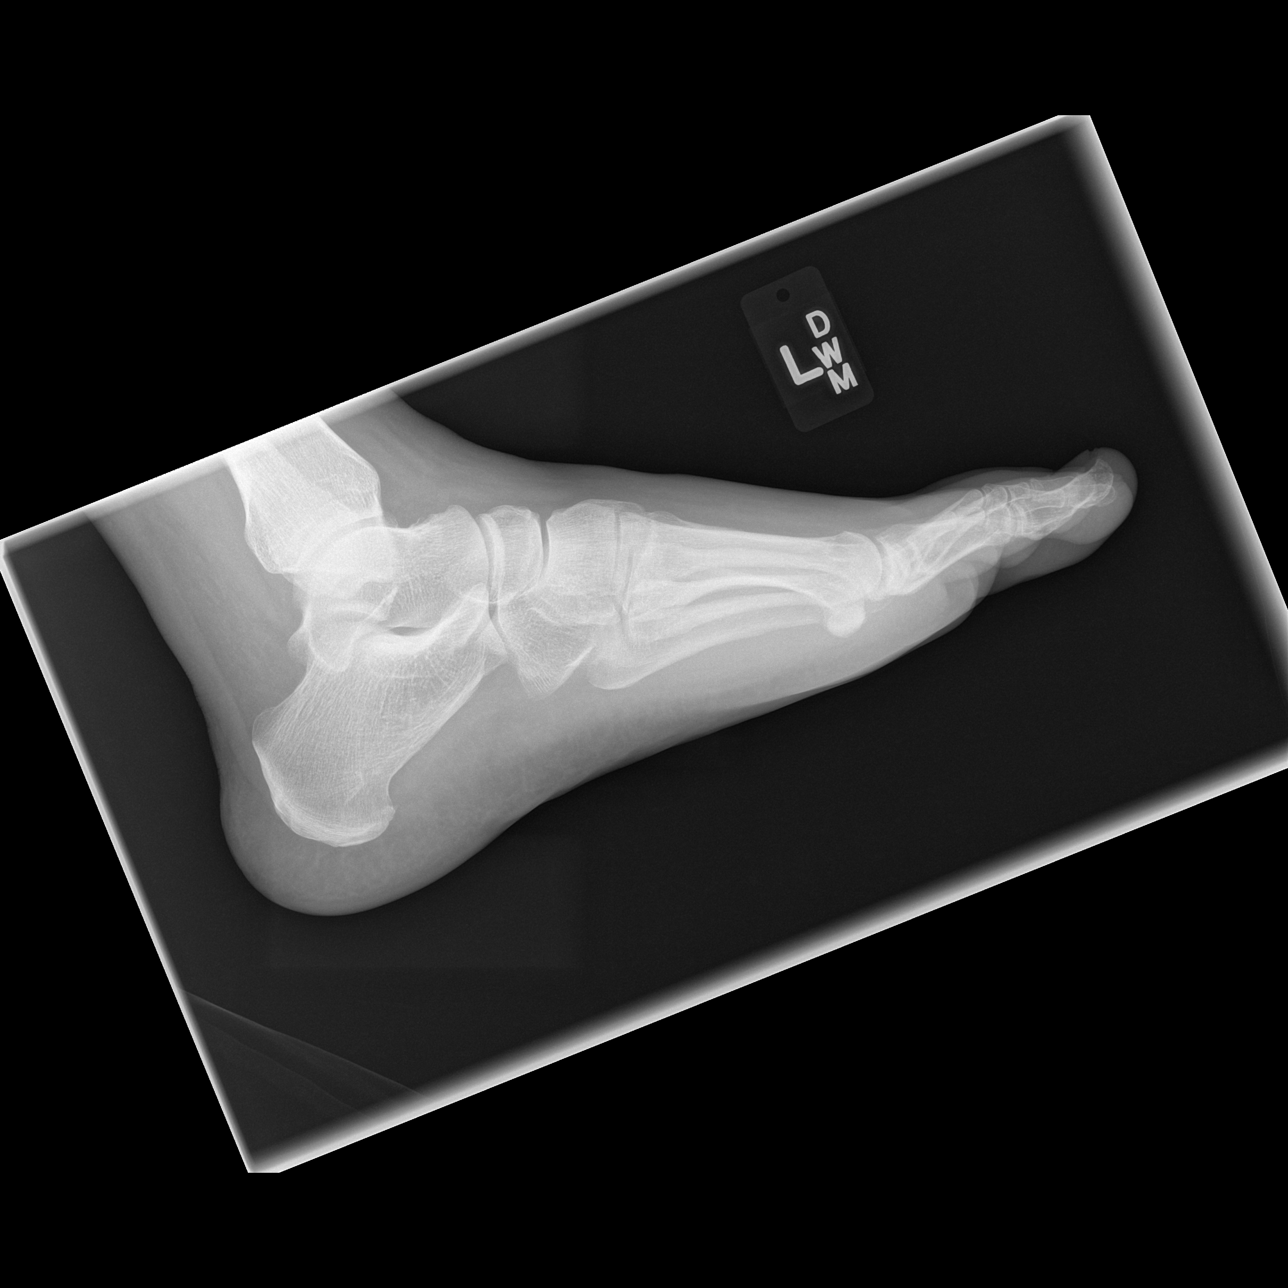

[3 of 3 positions shown; findings below may reference images not displayed]

FINDINGS: There is no evidence of fracture or dislocation. There is no
evidence of arthropathy or other focal bone abnormality. Soft
tissues are unremarkable.
IMPRESSION: Negative.

## 2017-04-19 ENCOUNTER — Emergency Department (HOSPITAL_BASED_OUTPATIENT_CLINIC_OR_DEPARTMENT_OTHER)
Admission: EM | Admit: 2017-04-19 | Discharge: 2017-04-19 | Disposition: A | Discharge status: Home / Self Care | Payer: Medicare Other | Attending: Emergency Medicine | Admitting: Emergency Medicine

## 2017-04-19 ENCOUNTER — Encounter (HOSPITAL_BASED_OUTPATIENT_CLINIC_OR_DEPARTMENT_OTHER): Payer: Self-pay | Admitting: *Deleted

## 2017-04-19 DIAGNOSIS — Z794 Long term (current) use of insulin: Secondary | ICD-10-CM | POA: Diagnosis not present

## 2017-04-19 DIAGNOSIS — I509 Heart failure, unspecified: Secondary | ICD-10-CM | POA: Insufficient documentation

## 2017-04-19 DIAGNOSIS — Z79899 Other long term (current) drug therapy: Secondary | ICD-10-CM | POA: Diagnosis not present

## 2017-04-19 DIAGNOSIS — Z87891 Personal history of nicotine dependence: Secondary | ICD-10-CM | POA: Diagnosis not present

## 2017-04-19 DIAGNOSIS — M25512 Pain in left shoulder: Secondary | ICD-10-CM | POA: Diagnosis present

## 2017-04-19 DIAGNOSIS — J449 Chronic obstructive pulmonary disease, unspecified: Secondary | ICD-10-CM | POA: Insufficient documentation

## 2017-04-19 DIAGNOSIS — Z7901 Long term (current) use of anticoagulants: Secondary | ICD-10-CM | POA: Diagnosis not present

## 2017-04-19 DIAGNOSIS — E119 Type 2 diabetes mellitus without complications: Secondary | ICD-10-CM | POA: Insufficient documentation

## 2017-04-19 DIAGNOSIS — I110 Hypertensive heart disease with heart failure: Secondary | ICD-10-CM | POA: Diagnosis not present
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
## 2017-04-19 NOTE — Discharge Instructions (Signed)
Please take Tylenol (acetaminophen) to relieve your pain.  You may take tylenol, up to 1,000 mg (two extra strength pills).  Do not take more than 4,000 mg tylenol in a 24 hour period.  Please check all medication labels as many medications such as pain and cold medications may contain tylenol. Please do not drink alcohol while taking this medication.   The best way to get rid of muscle pain is by  using heat, massage therapy, and gentle stretching/range of motion exercises.

## 2017-04-19 NOTE — ED Notes (Signed)
ED Provider at bedside. 
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
## 2017-07-18 DIAGNOSIS — Z7902 Long term (current) use of antithrombotics/antiplatelets: Secondary | ICD-10-CM | POA: Insufficient documentation

## 2017-07-18 DIAGNOSIS — J449 Chronic obstructive pulmonary disease, unspecified: Secondary | ICD-10-CM | POA: Diagnosis not present

## 2017-07-18 DIAGNOSIS — Z79899 Other long term (current) drug therapy: Secondary | ICD-10-CM | POA: Insufficient documentation

## 2017-07-18 DIAGNOSIS — E119 Type 2 diabetes mellitus without complications: Secondary | ICD-10-CM | POA: Diagnosis not present

## 2017-07-18 DIAGNOSIS — N489 Disorder of penis, unspecified: Secondary | ICD-10-CM | POA: Diagnosis present

## 2017-07-18 DIAGNOSIS — I509 Heart failure, unspecified: Secondary | ICD-10-CM | POA: Diagnosis not present

## 2017-07-18 DIAGNOSIS — Z7982 Long term (current) use of aspirin: Secondary | ICD-10-CM | POA: Insufficient documentation

## 2017-07-18 DIAGNOSIS — Z7901 Long term (current) use of anticoagulants: Secondary | ICD-10-CM | POA: Diagnosis not present

## 2017-07-18 DIAGNOSIS — Z711 Person with feared health complaint in whom no diagnosis is made: Secondary | ICD-10-CM | POA: Insufficient documentation

## 2017-07-18 DIAGNOSIS — I110 Hypertensive heart disease with heart failure: Secondary | ICD-10-CM | POA: Insufficient documentation

## 2017-07-18 DIAGNOSIS — Z87891 Personal history of nicotine dependence: Secondary | ICD-10-CM | POA: Diagnosis not present

## 2017-07-18 DIAGNOSIS — Z794 Long term (current) use of insulin: Secondary | ICD-10-CM | POA: Diagnosis not present

## 2017-07-19 ENCOUNTER — Other Ambulatory Visit: Payer: Self-pay

## 2017-07-19 ENCOUNTER — Encounter (HOSPITAL_BASED_OUTPATIENT_CLINIC_OR_DEPARTMENT_OTHER): Payer: Self-pay | Admitting: Emergency Medicine

## 2017-07-19 ENCOUNTER — Emergency Department (HOSPITAL_BASED_OUTPATIENT_CLINIC_OR_DEPARTMENT_OTHER)
Admission: EM | Admit: 2017-07-19 | Discharge: 2017-07-19 | Disposition: A | Discharge status: Home / Self Care | Payer: Medicare Other | Attending: Emergency Medicine | Admitting: Emergency Medicine

## 2017-07-19 DIAGNOSIS — N489 Disorder of penis, unspecified: Secondary | ICD-10-CM

## 2017-07-19 DIAGNOSIS — Z711 Person with feared health complaint in whom no diagnosis is made: Secondary | ICD-10-CM

## 2017-07-19 LAB — GC/CHLAMYDIA PROBE AMP (~~LOC~~) NOT AT ARMC
CHLAMYDIA, DNA PROBE: NEGATIVE
Neisseria Gonorrhea: NEGATIVE

## 2017-07-19 MED ORDER — AZITHROMYCIN 250 MG PO TABS
1000.0000 mg | ORAL_TABLET | Freq: Once | ORAL | Status: AC
  Administered 2017-07-19: 1000 mg via ORAL
  Filled 2017-07-19: qty 4

## 2017-07-19 MED ORDER — LIDOCAINE HCL (PF) 1 % IJ SOLN
INTRAMUSCULAR | Status: AC
  Administered 2017-07-19: 5 mL
  Filled 2017-07-19: qty 5

## 2017-07-19 MED ORDER — CEFTRIAXONE SODIUM 250 MG IJ SOLR
250.0000 mg | Freq: Once | INTRAMUSCULAR | Status: AC
  Administered 2017-07-19: 250 mg via INTRAMUSCULAR
  Filled 2017-07-19: qty 250

## 2017-07-19 MED ORDER — VALACYCLOVIR HCL 1 G PO TABS: 1000.0000 mg | ORAL_TABLET | Freq: Three times a day (TID) | ORAL | 0 refills | Status: AC

## 2017-07-19 NOTE — ED Provider Notes (Signed)
[facility] EMERGENCY DEPARTMENT Provider Note   CSN: [id number] Arrival date & time: 07/18/17  2347     History   Chief Complaint No chief complaint on file.   HPI Kevin Mckee is a 42 y.o. male.  HPI  This is a 42 year old male with a history of CHF, COPD, diabetes who presents with penile lesions.  Patient reports 2-3-day history of worsening sores over his penis.  He states that he has noted some small bumps that are like blisters.  They are painful.  He had one similar outbreak previously that was not painful and self-limited.  Denies any new sexual partners.  He does not use condoms consistently.  Denies any penile discharge or dysuria.  Denies fevers or systemic symptoms.  Denies any rashes anywhere else.  Past Medical History:  Diagnosis Date  . CHF (congestive heart failure) (HCC)   . COPD (chronic obstructive pulmonary disease) (HCC)   . Diabetes mellitus without complication (HCC)   . Gout   . Hypertension     There are no active problems to display for this patient.   Past Surgical History:  Procedure Laterality Date  . CARDIAC DEFIBRILLATOR PLACEMENT    . HEART TRANSPLANT         Home Medications    Prior to Admission medications   Medication Sig Start Date End Date Taking? Authorizing Provider  amiodarone (PACERONE) 200 MG tablet Take 200 mg by mouth daily.   Yes [provider]  digoxin (LANOXIN) 0.125 MG tablet Take 0.125 mg by mouth daily.   Yes [provider]  diltiazem (TIAZAC) 360 MG 24 hr capsule Take 360 mg by mouth daily.   Yes [provider]  lisinopril (PRINIVIL,ZESTRIL) 2.5 MG tablet Take 2.5 mg by mouth daily.   Yes [provider]  torsemide (DEMADEX) 100 MG tablet Take 100 mg by mouth daily.   Yes [provider]  warfarin (COUMADIN) 5 MG tablet Take 5 mg by mouth daily.   Yes [provider]  allopurinol (ZYLOPRIM) 300 MG tablet Take 300 mg by mouth daily.     [provider]  aspirin 81 MG tablet Take 81 mg by mouth daily.    [provider]  hydrALAZINE (APRESOLINE) 100 MG tablet Take 100 mg by mouth 3 (three) times daily.    [provider]  insulin aspart protamine- aspart (NOVOLOG MIX 70/30) (70-30) 100 UNIT/ML injection Inject 35 Units into the skin 2 (two) times daily with a meal.    [provider]  iron polysaccharides (NIFEREX) 150 MG capsule Take 150 mg by mouth daily.    [provider]  potassium chloride SA (K-DUR,KLOR-CON) 20 MEQ tablet Take 20 mEq by mouth 3 (three) times daily.    [provider]  pravastatin (PRAVACHOL) 80 MG tablet Take 80 mg by mouth daily.    [provider]  sildenafil (REVATIO) 20 MG tablet Take 20 mg by mouth 3 (three) times daily.    [provider]  spironolactone (ALDACTONE) 50 MG tablet Take 100 mg by mouth daily. Pt takes 50mg  two tabs daily    [provider]  valACYclovir (VALTREX) 1000 MG tablet Take 1 tablet (1,000 mg total) by mouth 3 (three) times daily for 10 days. 07/19/17 07/29/17  Horton, Mayer Maskerourtney F, MD  zolpidem (AMBIEN) 10 MG tablet Take 10 mg by mouth at bedtime as needed for sleep.    [provider]    Family History No family history on  file.  Social History Social History   Tobacco Use  . Smoking status: Former Games developer  . Smokeless tobacco: Never Used  Substance Use Topics  . Alcohol use: No  . Drug use: No     Allergies   Patient has no known allergies.   Review of Systems Review of Systems  Constitutional: Negative for fever.  Genitourinary: Positive for penile pain. Negative for discharge and penile swelling.       Penile lesions  All other systems reviewed and are negative.    Physical Exam Updated Vital Signs BP (!) 145/106 (BP Location: Right Arm)   Pulse 94   Temp 98.6 F (37 C) (Oral)   Resp 17   Ht 6\' 2"  (1.88 m)   Wt 115.7 kg (255 lb)   SpO2 96%   BMI 32.74 kg/m     Physical Exam  Constitutional: He is oriented to person, place, and time. He appears well-developed and well-nourished. No distress.  Overweight  HENT:  Head: Normocephalic and atraumatic.  Cardiovascular: Normal rate and regular rhythm.  Pulmonary/Chest: Effort normal. No respiratory distress.  Abdominal: Soft. There is no tenderness.  Genitourinary:  Genitourinary Comments: Circumcised penis, 2 small ulcerative lesions over the proximal aspect of the glands at the crease to the shaft, less than 0.5 cm each, no penile drainage noted, no lymphadenopathy  Neurological: He is alert and oriented to person, place, and time.  Skin: Skin is warm and dry.  Psychiatric: He has a normal mood and affect.  Nursing note and vitals reviewed.    ED Treatments / Results  Labs (all labs ordered are listed, but only abnormal results are displayed) Labs Reviewed  HERPES SIMPLEX VIRUS(HSV) DNA BY PCR  RPR  HIV ANTIBODY (ROUTINE TESTING)  GC/CHLAMYDIA PROBE AMP ([place]) NOT AT [facility]    EKG  EKG Interpretation None       Radiology No results found.  Procedures Procedures (including critical care time)  Medications Ordered in ED Medications  cefTRIAXone (ROCEPHIN) injection 250 mg (not administered)  azithromycin (ZITHROMAX) tablet 1,000 mg (not administered)     Initial Impression / Assessment and Plan / ED Course  I have reviewed the triage vital signs and the nursing notes.  Pertinent labs & imaging results that were available during my care of the patient were reviewed by me and considered in my medical decision making (see chart for details).     Patient presents with concerns for penile lesions.  Lesion suspicious for herpes.  Other considerations include syphilis or chancroid.  Tested for gonorrhea, chlamydia, RPR, herpes PCR, and HIV.  He was treated with Rocephin and azithromycin.  Will be discharged home with valacyclovir for presumed herpes.  Discussed with patient  safe sex practices.  After history, exam, and medical workup I feel the patient has been appropriately medically screened and is safe for discharge home. Pertinent diagnoses were discussed with the patient. Patient was given return precautions.   Final Clinical Impressions(s) / ED Diagnoses   Final diagnoses:  Penile lesion  Concern about STD in male without diagnosis    ED Discharge Orders        Ordered    valACYclovir (VALTREX) 1000 MG tablet  3 times daily     07/19/17 0135       Horton, Mayer Masker, MD 07/19/17 [phone]

## 2017-07-19 NOTE — Discharge Instructions (Signed)
You were seen today for penile lesion.  This is concerning for herpes.  Syphilis is also consideration.  You will be treated with valacyclovir.  STD testing was sent.  Abstain from sexual activity for the next 10 days.  Always use condoms.  If any testing is positive you will be notified.

## 2017-07-19 NOTE — ED Triage Notes (Addendum)
Pt states he noticed "a break out of small bumps like blisters" on his penis that were sore ~3 days ago. States this has happened before but he is concerned because the previous time there was no pain. Denies penile discharge or swelling.

## 2017-07-20 LAB — RPR: RPR Ser Ql: NONREACTIVE

## 2017-07-20 LAB — HIV ANTIBODY (ROUTINE TESTING W REFLEX): HIV Screen 4th Generation wRfx: NONREACTIVE

## 2017-07-20 LAB — HERPES SIMPLEX VIRUS(HSV) DNA BY PCR
HSV 1 DNA: NEGATIVE
HSV 2 DNA: POSITIVE — AB

## 2021-01-12 ENCOUNTER — Encounter (HOSPITAL_BASED_OUTPATIENT_CLINIC_OR_DEPARTMENT_OTHER): Payer: Self-pay | Admitting: Emergency Medicine

## 2021-01-12 ENCOUNTER — Other Ambulatory Visit: Payer: Self-pay

## 2021-01-12 ENCOUNTER — Emergency Department (HOSPITAL_BASED_OUTPATIENT_CLINIC_OR_DEPARTMENT_OTHER)
Admission: EM | Admit: 2021-01-12 | Discharge: 2021-01-12 | Disposition: A | Discharge status: Home / Self Care | Payer: Medicaid Other | Attending: Emergency Medicine | Admitting: Emergency Medicine

## 2021-01-12 DIAGNOSIS — Z87891 Personal history of nicotine dependence: Secondary | ICD-10-CM | POA: Insufficient documentation

## 2021-01-12 DIAGNOSIS — I110 Hypertensive heart disease with heart failure: Secondary | ICD-10-CM | POA: Insufficient documentation

## 2021-01-12 DIAGNOSIS — Z7982 Long term (current) use of aspirin: Secondary | ICD-10-CM | POA: Diagnosis not present

## 2021-01-12 DIAGNOSIS — M546 Pain in thoracic spine: Secondary | ICD-10-CM | POA: Insufficient documentation

## 2021-01-12 DIAGNOSIS — J449 Chronic obstructive pulmonary disease, unspecified: Secondary | ICD-10-CM | POA: Insufficient documentation

## 2021-01-12 DIAGNOSIS — Z7901 Long term (current) use of anticoagulants: Secondary | ICD-10-CM | POA: Diagnosis not present

## 2021-01-12 DIAGNOSIS — Z794 Long term (current) use of insulin: Secondary | ICD-10-CM | POA: Insufficient documentation

## 2021-01-12 DIAGNOSIS — Z79899 Other long term (current) drug therapy: Secondary | ICD-10-CM | POA: Insufficient documentation

## 2021-01-12 DIAGNOSIS — E119 Type 2 diabetes mellitus without complications: Secondary | ICD-10-CM | POA: Insufficient documentation

## 2021-01-12 DIAGNOSIS — Y92410 Unspecified street and highway as the place of occurrence of the external cause: Secondary | ICD-10-CM | POA: Diagnosis not present

## 2021-01-12 DIAGNOSIS — I509 Heart failure, unspecified: Secondary | ICD-10-CM | POA: Insufficient documentation

## 2021-01-12 NOTE — Discharge Instructions (Addendum)
Take 4 over the counter ibuprofen tablets 3 times a day or 2 over-the-counter naproxen tablets twice a day for pain. Also take tylenol 1000mg(2 extra strength) four times a day.    
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
## 2021-01-12 NOTE — ED Notes (Signed)
See EDP assessment 
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
## 2022-12-04 ENCOUNTER — Other Ambulatory Visit: Payer: Self-pay

## 2022-12-04 ENCOUNTER — Encounter (HOSPITAL_BASED_OUTPATIENT_CLINIC_OR_DEPARTMENT_OTHER): Payer: Self-pay | Admitting: Emergency Medicine

## 2022-12-04 ENCOUNTER — Emergency Department (HOSPITAL_BASED_OUTPATIENT_CLINIC_OR_DEPARTMENT_OTHER)
Admission: EM | Admit: 2022-12-04 | Discharge: 2022-12-04 | Disposition: A | Discharge status: Home / Self Care | Payer: Medicaid Other | Attending: Emergency Medicine | Admitting: Emergency Medicine

## 2022-12-04 DIAGNOSIS — Z79899 Other long term (current) drug therapy: Secondary | ICD-10-CM | POA: Insufficient documentation

## 2022-12-04 DIAGNOSIS — S134XXA Sprain of ligaments of cervical spine, initial encounter: Secondary | ICD-10-CM

## 2022-12-04 DIAGNOSIS — Z794 Long term (current) use of insulin: Secondary | ICD-10-CM | POA: Diagnosis not present

## 2022-12-04 DIAGNOSIS — S199XXA Unspecified injury of neck, initial encounter: Secondary | ICD-10-CM | POA: Diagnosis present

## 2022-12-04 DIAGNOSIS — I10 Essential (primary) hypertension: Secondary | ICD-10-CM | POA: Insufficient documentation

## 2022-12-04 DIAGNOSIS — S161XXA Strain of muscle, fascia and tendon at neck level, initial encounter: Secondary | ICD-10-CM | POA: Insufficient documentation

## 2022-12-04 DIAGNOSIS — Z7982 Long term (current) use of aspirin: Secondary | ICD-10-CM | POA: Diagnosis not present

## 2022-12-04 DIAGNOSIS — E119 Type 2 diabetes mellitus without complications: Secondary | ICD-10-CM | POA: Diagnosis not present

## 2022-12-04 MED ORDER — CYCLOBENZAPRINE HCL 10 MG PO TABS: 10.0000 mg | ORAL_TABLET | Freq: Two times a day (BID) | ORAL | 0 refills | Status: AC | PRN

## 2022-12-04 MED ORDER — LIDOCAINE 5 % EX PTCH
1.0000 | MEDICATED_PATCH | Freq: Once | CUTANEOUS | Status: DC
  Administered 2022-12-04: 1 via TRANSDERMAL
  Filled 2022-12-04: qty 1

## 2022-12-04 MED ORDER — LIDOCAINE 5 % EX PTCH: 1.0000 | MEDICATED_PATCH | CUTANEOUS | 0 refills | Status: DC

## 2022-12-04 MED ORDER — ACETAMINOPHEN 500 MG PO TABS
1000.0000 mg | ORAL_TABLET | Freq: Once | ORAL | Status: AC
  Administered 2022-12-04: 1000 mg via ORAL
  Filled 2022-12-04: qty 2

## 2022-12-04 MED ORDER — CYCLOBENZAPRINE HCL 10 MG PO TABS: 10.0000 mg | ORAL_TABLET | Freq: Two times a day (BID) | ORAL | 0 refills | Status: DC | PRN

## 2022-12-04 MED ORDER — LIDOCAINE 5 % EX PTCH: 1.0000 | MEDICATED_PATCH | CUTANEOUS | 0 refills | Status: AC
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
